# Patient Record
Sex: Female | Born: 1975 | Race: Black or African American | Hispanic: No | Marital: Married | Smoking: Never smoker
Health system: Southern US, Community
[De-identification: ages and names within clinical notes are randomized; demographics above are authoritative.]

---

## 2007-04-12 ENCOUNTER — Inpatient Hospital Stay (HOSPITAL_COMMUNITY): Admission: AD | Admit: 2007-04-12 | Discharge: 2007-04-14 | Payer: Self-pay | Admitting: Obstetrics & Gynecology

## 2007-04-15 ENCOUNTER — Encounter (HOSPITAL_COMMUNITY): Admission: RE | Admit: 2007-04-15 | Discharge: 2007-05-15 | Payer: Self-pay | Admitting: Obstetrics and Gynecology

## 2011-03-02 LAB — CBC
HCT: 38.5
MCHC: 33.8
MCHC: 34.4
MCV: 88.5
Platelets: 284
Platelets: 285
RBC: 4.57
RDW: 14.8
WBC: 13.7 — ABNORMAL HIGH

## 2011-03-02 LAB — CCBB MATERNAL DONOR DRAW

## 2016-10-28 ENCOUNTER — Other Ambulatory Visit: Payer: Self-pay | Admitting: Obstetrics and Gynecology

## 2016-10-28 DIAGNOSIS — R928 Other abnormal and inconclusive findings on diagnostic imaging of breast: Secondary | ICD-10-CM

## 2016-11-03 ENCOUNTER — Ambulatory Visit
Admission: RE | Admit: 2016-11-03 | Discharge: 2016-11-03 | Disposition: A | Discharge status: Home / Self Care | Payer: Federal, State, Local not specified - PPO | Source: Ambulatory Visit | Attending: Obstetrics and Gynecology | Admitting: Obstetrics and Gynecology

## 2016-11-03 ENCOUNTER — Ambulatory Visit
Admission: RE | Admit: 2016-11-03 | Discharge: 2016-11-03 | Disposition: A | Discharge status: Home / Self Care | Payer: Self-pay | Source: Ambulatory Visit | Attending: Obstetrics and Gynecology | Admitting: Obstetrics and Gynecology

## 2016-11-03 DIAGNOSIS — R928 Other abnormal and inconclusive findings on diagnostic imaging of breast: Secondary | ICD-10-CM

## 2017-12-26 ENCOUNTER — Other Ambulatory Visit: Payer: Self-pay | Admitting: Obstetrics and Gynecology

## 2017-12-26 DIAGNOSIS — R928 Other abnormal and inconclusive findings on diagnostic imaging of breast: Secondary | ICD-10-CM

## 2017-12-28 ENCOUNTER — Ambulatory Visit
Admission: RE | Admit: 2017-12-28 | Discharge: 2017-12-28 | Disposition: A | Discharge status: Home / Self Care | Payer: Federal, State, Local not specified - PPO | Source: Ambulatory Visit | Attending: Obstetrics and Gynecology | Admitting: Obstetrics and Gynecology

## 2017-12-28 DIAGNOSIS — R928 Other abnormal and inconclusive findings on diagnostic imaging of breast: Secondary | ICD-10-CM

## 2018-05-24 HISTORY — PX: OTHER SURGICAL HISTORY: SHX169

## 2018-10-11 ENCOUNTER — Other Ambulatory Visit: Payer: Self-pay | Admitting: Obstetrics and Gynecology

## 2018-10-11 DIAGNOSIS — N6320 Unspecified lump in the left breast, unspecified quadrant: Secondary | ICD-10-CM

## 2018-10-31 ENCOUNTER — Other Ambulatory Visit: Payer: Self-pay

## 2018-10-31 ENCOUNTER — Other Ambulatory Visit: Payer: Self-pay | Admitting: Obstetrics and Gynecology

## 2018-10-31 ENCOUNTER — Ambulatory Visit
Admission: RE | Admit: 2018-10-31 | Discharge: 2018-10-31 | Disposition: A | Discharge status: Home / Self Care | Payer: Federal, State, Local not specified - PPO | Source: Ambulatory Visit | Attending: Obstetrics and Gynecology | Admitting: Obstetrics and Gynecology

## 2018-10-31 DIAGNOSIS — N6320 Unspecified lump in the left breast, unspecified quadrant: Secondary | ICD-10-CM

## 2018-11-02 ENCOUNTER — Other Ambulatory Visit: Payer: Self-pay

## 2018-11-02 ENCOUNTER — Ambulatory Visit
Admission: RE | Admit: 2018-11-02 | Discharge: 2018-11-02 | Disposition: A | Discharge status: Home / Self Care | Payer: Federal, State, Local not specified - PPO | Source: Ambulatory Visit | Attending: Obstetrics and Gynecology | Admitting: Obstetrics and Gynecology

## 2018-11-02 DIAGNOSIS — N6320 Unspecified lump in the left breast, unspecified quadrant: Secondary | ICD-10-CM

## 2019-05-25 HISTORY — PX: OTHER SURGICAL HISTORY: SHX169

## 2021-04-14 IMAGING — US ULTRASOUND GUIDED BREAST CYST ASPIRATION
1 series · 2 of 2 positions shown · non-contrast
Comparison: Previous exams.

CLINICAL DATA: 43-year-old female presenting for ultrasound-guided
aspiration of a benign left breast cyst.

EXAM:
ULTRASOUND GUIDED LEFT BREAST CYST ASPIRATION

[Series 1: ultrasound guided breast cyst aspiration · 0.06mm/px · 2 of 2 slices shown]
[im 1/2]
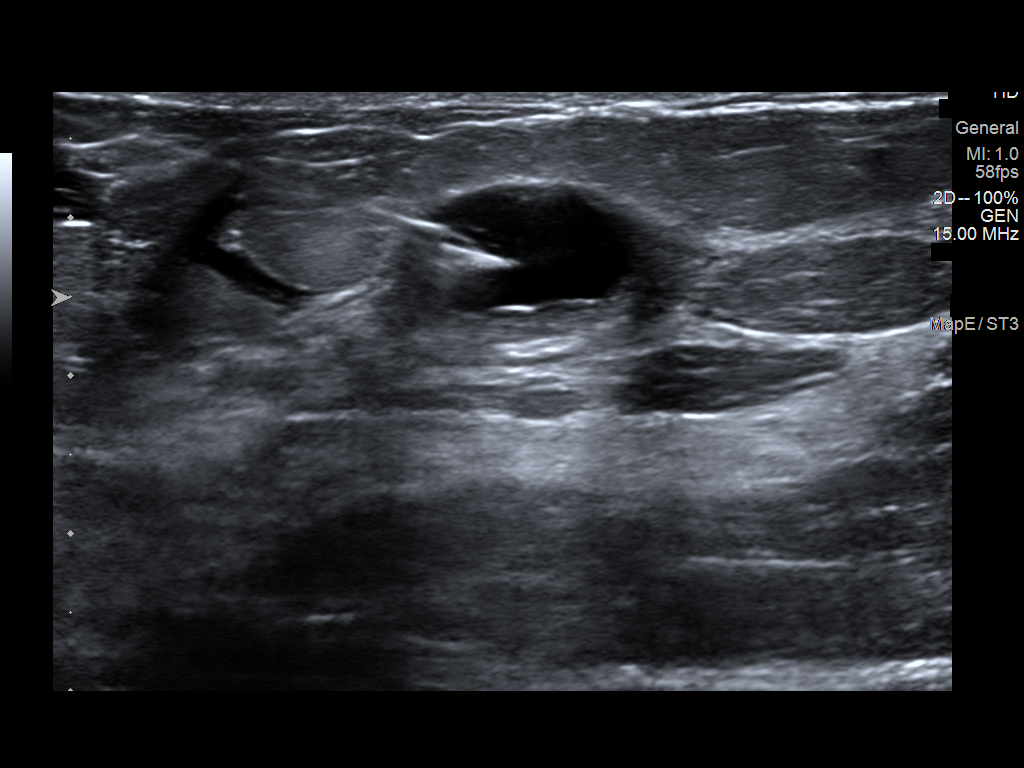
[im 2/2]
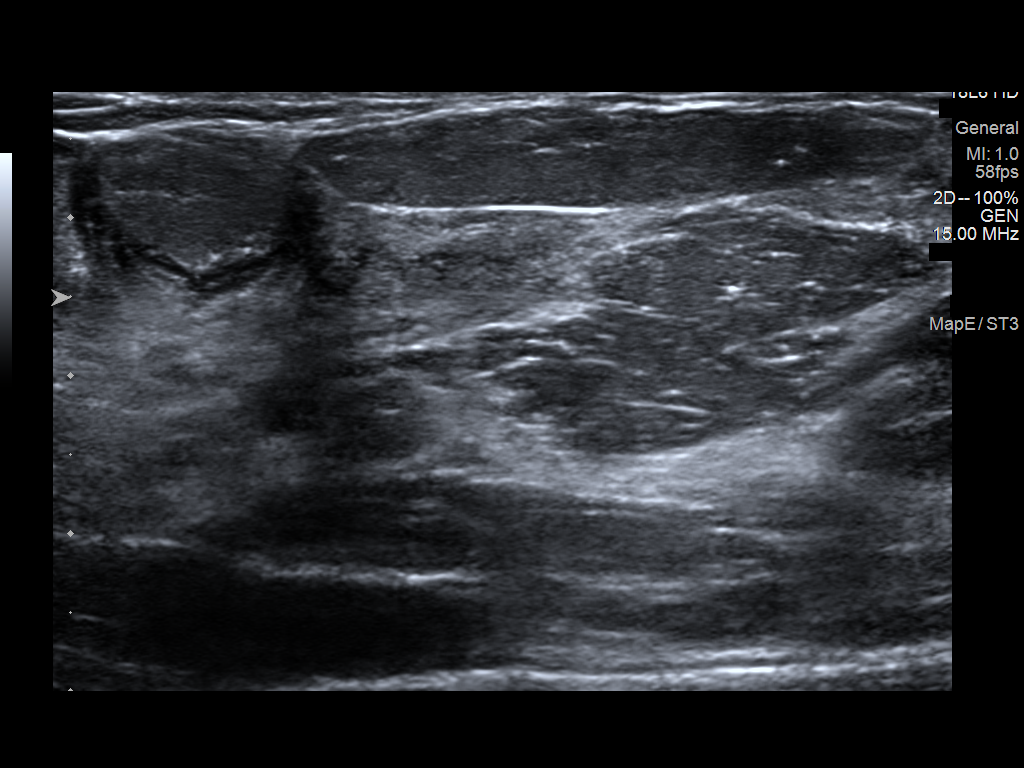

[2 of 2 positions shown; findings below may reference images not displayed]

PROCEDURE:
Using sterile technique, 1% lidocaine, under direct ultrasound
visualization, needle aspiration of a cyst at the 1 o'clock position
of the left breast was performed. The cyst was aspirated to
completion.
IMPRESSION: Ultrasound-guided aspiration of a left breast cyst to completion. No
apparent complications.

RECOMMENDATIONS:
Annual screening mammography in December 2018.

## 2021-06-17 ENCOUNTER — Encounter (HOSPITAL_BASED_OUTPATIENT_CLINIC_OR_DEPARTMENT_OTHER): Payer: Self-pay | Admitting: Obstetrics and Gynecology

## 2021-06-17 ENCOUNTER — Other Ambulatory Visit: Payer: Self-pay

## 2021-06-17 NOTE — Progress Notes (Addendum)
Spoke w/ via phone for pre-op interview---pt Lab needs dos----   t & s, urine preg            Lab results------cbc and cmp 1-05-26-2021 epic can use for 06-22-2021  surgery per dr Corinna Capra COVID test -----patient states asymptomatic no test needed Arrive at -------530 am 06-22-2021 NPO after MN NO Solid Food.  Clear liquids from MN until---430 am Med rec completed Medications to take morning of surgery -----none Diabetic medication -----n/a Patient instructed no nail polish to be worn day of surgery Patient instructed to bring photo id and insurance card day of surgery Patient aware to have Driver (ride ) / caregiver    for 24 hours after surgery  Elizabeth Shannon spouse Pre-Op special Istructions -----none Patient verbalized understanding of instructions that were given at this phone interview. Patient denies shortness of breath, chest pain, fever, cough at this phone interview.

## 2021-06-21 NOTE — H&P (Addendum)
Elizabeth Shannon is an 46 y.o. female with AUB, IUD and known fibroids.  SHe underwent a SHG showing several fibroids including a 2.4cm intracavitary fibroid.  She presents for Surgical evaluation and treatment of this including resection of fibroid and sonata fibroid ablation as well as removal of her IUD No LMP recorded.    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 No pertinent family history.  Social History:  reports that she has never smoked. She has never used smokeless tobacco. She reports current alcohol use. She reports that she does not use drugs.  Allergies:  Allergies  Allergen Reactions   Other     Gluten sensitivity hives whelps    Sulfa Antibiotics Rash    No medications prior to admission.    Review of Systems  Height 5\' 4"  (1.626 m), weight 113.4 kg. Physical Exam  Vitals and nursing note reviewed. Exam conducted with a chaperone present.  Constitutional:      Appearance: Normal appearance.  HENT:     Head: Normocephalic.  Eyes:     Pupils: Pupils are equal, round, and reactive to light.  Cardiovascular:     Rate and Rhythm: Normal rate and regular rhythm.     Pulses: Normal pulses.  Abdominal:     General: Abdomen is Gravid, nontender Neurological:     Mental Status: She is alert.  Uterus 8-10 weeks size SHG Shows 3.7cm,3.3 cm, and 2.1cm intramural  fibroids and the 2.4cm intracavitary fibroid  No results found for this or any previous visit (from the past 24 hour(s)).  No results found.  Assessment/Plan: AUB Uterine fibroids 3 of which are intramural and one intracavitary IUD Plan removing the IUD and myosure resection of intracavitary fibroid and then Sonata fibroid ablation of the intramural fibroids. Risks and benefits of the procedure were discussed at length which include but not limited to risks of infection, injury to bowel, bladder, uterus, ovaries, risk of blood loss, blood transfusion, possible laparotomy, and risks associated with anesthesia.  She gives her informed consent and wishes to proceed.   Luz Lex 06/21/2021, 9:19 PM  This patient has been seen and examined.   All of her questions were answered.  Labs and vital signs reviewed.  Informed consent has been obtained.  The History and Physical is current.

## 2021-06-21 NOTE — Anesthesia Preprocedure Evaluation (Addendum)
Anesthesia Evaluation  Patient identified by MRN, date of birth, ID band Patient awake    Reviewed: Allergy & Precautions, NPO status , Patient's Chart, lab work & pertinent test results  Airway Mallampati: II  TM Distance: >3 FB Neck ROM: Full    Dental no notable dental hx. (+) Dental Advisory Given, Teeth Intact   Pulmonary neg pulmonary ROS,    Pulmonary exam normal breath sounds clear to auscultation       Cardiovascular negative cardio ROS Normal cardiovascular exam Rhythm:Regular Rate:Normal     Neuro/Psych negative neurological ROS     GI/Hepatic Neg liver ROS, GERD  ,  Endo/Other  Morbid obesity  Renal/GU negative Renal ROS     Musculoskeletal  (+) Arthritis ,   Abdominal (+) + obese,   Peds  Hematology negative hematology ROS (+)   Anesthesia Other Findings   Reproductive/Obstetrics                           Anesthesia Physical Anesthesia Plan  ASA: 3  Anesthesia Plan: General   Post-op Pain Management: Minimal or no pain anticipated, Tylenol PO (pre-op) and Toradol IV (intra-op)   Induction: Intravenous  PONV Risk Score and Plan: 4 or greater and Treatment may vary due to age or medical condition, Midazolam, Ondansetron, Dexamethasone and Scopolamine patch - Pre-op  Airway Management Planned: LMA  Additional Equipment:   Intra-op Plan:   Post-operative Plan: Extubation in OR  Informed Consent: I have reviewed the patients History and Physical, chart, labs and discussed the procedure including the risks, benefits and alternatives for the proposed anesthesia with the patient or authorized representative who has indicated his/her understanding and acceptance.     Dental advisory given  Plan Discussed with: CRNA  Anesthesia Plan Comments:        Anesthesia Quick Evaluation

## 2021-06-22 ENCOUNTER — Ambulatory Visit (HOSPITAL_BASED_OUTPATIENT_CLINIC_OR_DEPARTMENT_OTHER)
Admission: RE | Admit: 2021-06-22 | Discharge: 2021-06-22 | Disposition: A | Discharge status: Home / Self Care | Payer: Federal, State, Local not specified - PPO | Attending: Obstetrics and Gynecology | Admitting: Obstetrics and Gynecology

## 2021-06-22 ENCOUNTER — Ambulatory Visit (HOSPITAL_BASED_OUTPATIENT_CLINIC_OR_DEPARTMENT_OTHER): Payer: Federal, State, Local not specified - PPO | Admitting: Anesthesiology

## 2021-06-22 ENCOUNTER — Other Ambulatory Visit: Payer: Self-pay

## 2021-06-22 ENCOUNTER — Encounter (HOSPITAL_BASED_OUTPATIENT_CLINIC_OR_DEPARTMENT_OTHER)
Admission: RE | Disposition: A | Discharge status: Home / Self Care | Payer: Self-pay | Source: Home / Self Care | Attending: Obstetrics and Gynecology

## 2021-06-22 ENCOUNTER — Encounter (HOSPITAL_BASED_OUTPATIENT_CLINIC_OR_DEPARTMENT_OTHER): Payer: Self-pay | Admitting: Obstetrics and Gynecology

## 2021-06-22 DIAGNOSIS — Z01818 Encounter for other preprocedural examination: Secondary | ICD-10-CM

## 2021-06-22 DIAGNOSIS — D251 Intramural leiomyoma of uterus: Secondary | ICD-10-CM | POA: Insufficient documentation

## 2021-06-22 DIAGNOSIS — Z6841 Body Mass Index (BMI) 40.0 and over, adult: Secondary | ICD-10-CM | POA: Insufficient documentation

## 2021-06-22 DIAGNOSIS — Z30432 Encounter for removal of intrauterine contraceptive device: Secondary | ICD-10-CM | POA: Insufficient documentation

## 2021-06-22 DIAGNOSIS — N939 Abnormal uterine and vaginal bleeding, unspecified: Secondary | ICD-10-CM | POA: Insufficient documentation

## 2021-06-22 HISTORY — PX: DILATATION & CURETTAGE/HYSTEROSCOPY WITH MYOSURE: SHX6511

## 2021-06-22 LAB — TYPE AND SCREEN
ABO/RH(D): B POS
Antibody Screen: NEGATIVE

## 2021-06-22 LAB — ABO/RH: ABO/RH(D): B POS

## 2021-06-22 LAB — POCT PREGNANCY, URINE: Preg Test, Ur: NEGATIVE

## 2021-06-22 SURGERY — DILATATION & CURETTAGE/HYSTEROSCOPY WITH MYOSURE
Anesthesia: General | Site: Vagina

## 2021-06-22 MED ORDER — SODIUM CHLORIDE 0.9 % IV SOLN
INTRAVENOUS | Status: AC
  Filled 2021-06-22: qty 2

## 2021-06-22 MED ORDER — FENTANYL CITRATE (PF) 100 MCG/2ML IJ SOLN
INTRAMUSCULAR | Status: DC | PRN
Start: 2021-06-22 — End: 2021-06-22
  Administered 2021-06-22 (×4): 50 ug via INTRAVENOUS

## 2021-06-22 MED ORDER — POVIDONE-IODINE 10 % EX SWAB: 2.0000 "application " | Freq: Once | CUTANEOUS | Status: DC

## 2021-06-22 MED ORDER — FENTANYL CITRATE (PF) 100 MCG/2ML IJ SOLN
INTRAMUSCULAR | Status: AC
  Filled 2021-06-22: qty 2

## 2021-06-22 MED ORDER — LACTATED RINGERS IV SOLN: INTRAVENOUS | Status: DC

## 2021-06-22 MED ORDER — FENTANYL CITRATE (PF) 100 MCG/2ML IJ SOLN
25.0000 ug | INTRAMUSCULAR | Status: DC | PRN
  Administered 2021-06-22: 50 ug via INTRAVENOUS

## 2021-06-22 MED ORDER — MEPERIDINE HCL 25 MG/ML IJ SOLN: 6.2500 mg | INTRAMUSCULAR | Status: DC | PRN

## 2021-06-22 MED ORDER — KETOROLAC TROMETHAMINE 30 MG/ML IJ SOLN
INTRAMUSCULAR | Status: DC | PRN
Start: 2021-06-22 — End: 2021-06-22
  Administered 2021-06-22: 30 mg via INTRAVENOUS

## 2021-06-22 MED ORDER — KETOROLAC TROMETHAMINE 30 MG/ML IJ SOLN
INTRAMUSCULAR | Status: AC
  Filled 2021-06-22: qty 1

## 2021-06-22 MED ORDER — DEXAMETHASONE SODIUM PHOSPHATE 10 MG/ML IJ SOLN
INTRAMUSCULAR | Status: AC
  Filled 2021-06-22: qty 1

## 2021-06-22 MED ORDER — PROMETHAZINE HCL 25 MG/ML IJ SOLN: 6.2500 mg | INTRAMUSCULAR | Status: DC | PRN

## 2021-06-22 MED ORDER — STERILE WATER FOR IRRIGATION IR SOLN
Status: DC | PRN
  Administered 2021-06-22: 500 mL

## 2021-06-22 MED ORDER — LIDOCAINE HCL (CARDIAC) PF 100 MG/5ML IV SOSY
PREFILLED_SYRINGE | INTRAVENOUS | Status: DC | PRN
Start: 2021-06-22 — End: 2021-06-22
  Administered 2021-06-22: 100 mg via INTRAVENOUS

## 2021-06-22 MED ORDER — SODIUM CHLORIDE 0.9 % IR SOLN
Status: DC | PRN
  Administered 2021-06-22: 3000 mL

## 2021-06-22 MED ORDER — SCOPOLAMINE 1 MG/3DAYS TD PT72
1.0000 | MEDICATED_PATCH | TRANSDERMAL | Status: DC
  Administered 2021-06-22: 1.5 mg via TRANSDERMAL

## 2021-06-22 MED ORDER — ONDANSETRON HCL 4 MG/2ML IJ SOLN
INTRAMUSCULAR | Status: DC | PRN
Start: 2021-06-22 — End: 2021-06-22
  Administered 2021-06-22: 4 mg via INTRAVENOUS

## 2021-06-22 MED ORDER — LIDOCAINE HCL (PF) 2 % IJ SOLN
INTRAMUSCULAR | Status: AC
  Filled 2021-06-22: qty 5

## 2021-06-22 MED ORDER — MIDAZOLAM HCL 5 MG/5ML IJ SOLN
INTRAMUSCULAR | Status: DC | PRN
  Administered 2021-06-22: 2 mg via INTRAVENOUS

## 2021-06-22 MED ORDER — ACETAMINOPHEN 500 MG PO TABS
ORAL_TABLET | ORAL | Status: AC
  Filled 2021-06-22: qty 2

## 2021-06-22 MED ORDER — PROPOFOL 10 MG/ML IV BOLUS
INTRAVENOUS | Status: AC
  Filled 2021-06-22: qty 20

## 2021-06-22 MED ORDER — DEXAMETHASONE SODIUM PHOSPHATE 4 MG/ML IJ SOLN
INTRAMUSCULAR | Status: DC | PRN
  Administered 2021-06-22: 10 mg via INTRAVENOUS

## 2021-06-22 MED ORDER — MIDAZOLAM HCL 2 MG/2ML IJ SOLN
INTRAMUSCULAR | Status: AC
  Filled 2021-06-22: qty 2

## 2021-06-22 MED ORDER — PROPOFOL 10 MG/ML IV BOLUS
INTRAVENOUS | Status: DC | PRN
  Administered 2021-06-22: 200 mg via INTRAVENOUS

## 2021-06-22 MED ORDER — SCOPOLAMINE 1 MG/3DAYS TD PT72
MEDICATED_PATCH | TRANSDERMAL | Status: AC
  Filled 2021-06-22: qty 1

## 2021-06-22 MED ORDER — ONDANSETRON HCL 4 MG/2ML IJ SOLN
INTRAMUSCULAR | Status: AC
  Filled 2021-06-22: qty 2

## 2021-06-22 MED ORDER — ACETAMINOPHEN 500 MG PO TABS
1000.0000 mg | ORAL_TABLET | Freq: Once | ORAL | Status: AC
  Administered 2021-06-22: 1000 mg via ORAL

## 2021-06-22 MED ORDER — SODIUM CHLORIDE 0.9 % IV SOLN
2.0000 g | INTRAVENOUS | Status: AC
  Administered 2021-06-22: 2 g via INTRAVENOUS

## 2021-06-22 SURGICAL SUPPLY — 36 items
BIPOLAR CUTTING LOOP 21FR (ELECTRODE)
CATH ROBINSON RED A/P 16FR (CATHETERS) ×4 IMPLANT
COUNTER NEEDLE 1200 MAGNETIC (NEEDLE) ×4 IMPLANT
DEVICE MYOSURE LITE (MISCELLANEOUS) IMPLANT
DEVICE MYOSURE REACH (MISCELLANEOUS) IMPLANT
DILATOR CANAL MILEX (MISCELLANEOUS) IMPLANT
DRSG TELFA 3X8 NADH (GAUZE/BANDAGES/DRESSINGS) ×3 IMPLANT
ELECT DISPERSIVE SONATA (ELECTRODE) ×8 IMPLANT
ELECT REM PT RETURN 9FT ADLT (ELECTROSURGICAL)
ELECTRODE REM PT RTRN 9FT ADLT (ELECTROSURGICAL) IMPLANT
GAUZE 4X4 16PLY ~~LOC~~+RFID DBL (SPONGE) ×8 IMPLANT
GLOVE SURG ENC MOIS LTX SZ8 (GLOVE) ×4 IMPLANT
GLOVE SURG ORTHO LTX SZ8 (GLOVE) ×8 IMPLANT
GLOVE SURG UNDER POLY LF SZ6 (GLOVE) ×6 IMPLANT
GLOVE SURG UNDER POLY LF SZ7.5 (GLOVE) ×2 IMPLANT
GOWN STRL REUS W/ TWL XL LVL3 (GOWN DISPOSABLE) ×3 IMPLANT
GOWN STRL REUS W/TWL LRG LVL3 (GOWN DISPOSABLE) ×6 IMPLANT
GOWN STRL REUS W/TWL XL LVL3 (GOWN DISPOSABLE) ×7 IMPLANT
HANDPIECE RFA SONATA (MISCELLANEOUS) ×4 IMPLANT
IV NS 1000ML (IV SOLUTION) ×3
IV NS 1000ML BAXH (IV SOLUTION) ×1 IMPLANT
IV NS IRRIG 3000ML ARTHROMATIC (IV SOLUTION) ×4 IMPLANT
KIT PROCEDURE FLUENT (KITS) ×4 IMPLANT
KIT TURNOVER CYSTO (KITS) ×4 IMPLANT
LOOP CUTTING BIPOLAR 21FR (ELECTRODE) IMPLANT
MYOSURE XL FIBROID (MISCELLANEOUS) ×3
PACK VAGINAL MINOR WOMEN LF (CUSTOM PROCEDURE TRAY) ×4 IMPLANT
PAD DRESSING TELFA 3X8 NADH (GAUZE/BANDAGES/DRESSINGS) ×2 IMPLANT
PAD OB MATERNITY 4.3X12.25 (PERSONAL CARE ITEMS) ×4 IMPLANT
PAD PREP 24X48 CUFFED NSTRL (MISCELLANEOUS) ×4 IMPLANT
SEAL CERVICAL OMNI LOK (ABLATOR) IMPLANT
SEAL ROD LENS SCOPE MYOSURE (ABLATOR) ×4 IMPLANT
SYR 50ML LL SCALE MARK (SYRINGE) ×4 IMPLANT
SYSTEM TISS REMOVAL MYOSURE XL (MISCELLANEOUS) ×1 IMPLANT
TOWEL OR 17X26 10 PK STRL BLUE (TOWEL DISPOSABLE) ×4 IMPLANT
WATER STERILE IRR 500ML POUR (IV SOLUTION) ×4 IMPLANT

## 2021-06-22 NOTE — Anesthesia Postprocedure Evaluation (Signed)
Anesthesia Post Note  Patient: Elizabeth Shannon  Procedure(s) Performed: DILATATION & CURETTAGE/HYSTEROSCOPY WITH MYOSURE (Vagina ) Radio Frequency Ablation with Sonata (Vagina )     Patient location during evaluation: PACU Anesthesia Type: General Level of consciousness: sedated and patient cooperative Pain management: pain level controlled Vital Signs Assessment: post-procedure vital signs reviewed and stable Respiratory status: spontaneous breathing Cardiovascular status: stable Anesthetic complications: no   No notable events documented.  Last Vitals:  Vitals:   06/22/21 0915 06/22/21 0935  BP: (!) 142/94 138/89  Pulse: 71 68  Resp: 11 18  Temp: (!) 36.3 C (!) 36.2 C  SpO2: 97% 100%    Last Pain:  Vitals:   06/22/21 1000  TempSrc:   PainSc: [place]

## 2021-06-22 NOTE — Anesthesia Procedure Notes (Signed)
Procedure Name: LMA Insertion Date/Time: 06/22/2021 7:43 AM Performed by: Justice Rocher, CRNA Pre-anesthesia Checklist: Patient identified, Emergency Drugs available, Suction available, Patient being monitored and Timeout performed Patient Re-evaluated:Patient Re-evaluated prior to induction Oxygen Delivery Method: Circle system utilized Preoxygenation: Pre-oxygenation with 100% oxygen Induction Type: IV induction Ventilation: Mask ventilation without difficulty LMA: LMA inserted LMA Size: 4.0 Number of attempts: 1 Airway Equipment and Method: Bite block Placement Confirmation: positive ETCO2, breath sounds checked- equal and bilateral and CO2 detector Tube secured with: Tape Dental Injury: Teeth and Oropharynx as per pre-operative assessment

## 2021-06-22 NOTE — Transfer of Care (Signed)
Immediate Anesthesia Transfer of Care Note  Patient: Elizabeth Shannon  Procedure(s) Performed: Procedure(s) (LRB): DILATATION & CURETTAGE/HYSTEROSCOPY WITH MYOSURE (N/A) Radio Frequency Ablation with Sonata (N/A)  Patient Location: PACU  Anesthesia Type: General  Level of Consciousness: awake, sedated, patient cooperative and responds to stimulation  Airway & Oxygen Therapy: Patient Spontanous Breathing and Patient connected to  02 and soft FM   Post-op Assessment: Report given to PACU RN, Post -op Vital signs reviewed and stable and Patient moving all extremities  Post vital signs: Reviewed and stable  Complications: No apparent anesthesia complications

## 2021-06-22 NOTE — Discharge Instructions (Addendum)
DISCHARGE INSTRUCTIONS: D&C / D&E The following instructions have been prepared to help you care for yourself upon your return home.   Personal hygiene:  Use sanitary pads for vaginal drainage, not tampons.  Shower the day after your procedure.  NO tub baths, pools or Jacuzzis for 2-3 weeks.  Wipe front to back after using the bathroom.  Activity and limitations:  Do NOT drive or operate any equipment for 24 hours. The effects of anesthesia are still present and drowsiness may result.  Do NOT rest in bed all day.  Walking is encouraged.  Walk up and down stairs slowly.  You may resume your normal activity in one to two days or as indicated by your physician.  Sexual activity: NO intercourse for at least 2 weeks after the procedure, or as indicated by your physician.  Diet: Eat a light meal as desired this evening. You may resume your usual diet tomorrow.  Return to work: You may resume your work activities in one to two days or as indicated by your doctor.  What to expect after your surgery: Expect to have vaginal bleeding/discharge for 2-3 days and spotting for up to 10 days. It is not unusual to have soreness for up to 1-2 weeks. You may have a slight burning sensation when you urinate for the first day. Mild cramps may continue for a couple of days. You may have a regular period in 2-6 weeks.  Call your doctor for any of the following:  Excessive vaginal bleeding, saturating and changing one pad every hour.  Inability to urinate 6 hours after discharge from hospital.  Pain not relieved by pain medication.  Fever of 100.4 F or greater.  Unusual vaginal discharge or odor.  Post Anesthesia Home Care Instructions  Activity: Get plenty of rest for the remainder of the day. A responsible individual must stay with you for 24 hours following the procedure.  For the next 24 hours, DO NOT: -Drive a car -Paediatric nurse -Drink alcoholic beverages -Take any medication unless  instructed by your physician -Make any legal decisions or sign important papers.  Meals: Start with liquid foods such as gelatin or soup. Progress to regular foods as tolerated. Avoid greasy, spicy, heavy foods. If nausea and/or vomiting occur, drink only clear liquids until the nausea and/or vomiting subsides. Call your physician if vomiting continues.  Special Instructions/Symptoms: Your throat may feel dry or sore from the anesthesia or the breathing tube placed in your throat during surgery. If this causes discomfort, gargle with warm salt water. The discomfort should disappear within 24 hours.  If you had a scopolamine patch placed behind your ear for the management of post- operative nausea and/or vomiting:  1. The medication in the patch is effective for 72 hours, after which it should be removed.  Wrap patch in a tissue and discard in the trash. Wash hands thoroughly with soap and water. 2. You may remove the patch earlier than 72 hours if you experience unpleasant side effects which may include dry mouth, dizziness or visual disturbances. 3. Avoid touching the patch. Wash your hands with soap and water after contact with the patch.  No acetaminophen/Tylenol until after 12:15 pm today if needed. No ibuprofen, Advil, Aleve, Motrin, ketorolac, meloxicam, naproxen, or other NSAIDS until after 2:45 pm today if needed.

## 2021-06-23 ENCOUNTER — Encounter (HOSPITAL_BASED_OUTPATIENT_CLINIC_OR_DEPARTMENT_OTHER): Payer: Self-pay | Admitting: Obstetrics and Gynecology

## 2021-06-23 LAB — SURGICAL PATHOLOGY

## 2021-06-23 NOTE — Op Note (Signed)
NAME: Elizabeth Shannon, Elizabeth Shannon MEDICAL RECORD NO: [id number] ACCOUNT NO: [id number] DATE OF BIRTH: 1975/09/27 FACILITY: [facility] LOCATION: [facility] PHYSICIAN: Monia Sabal. Corinna Capra, MD  Operative Report   DATE OF PROCEDURE: 06/22/2021  PREOPERATIVE DIAGNOSES:  Abnormal uterine bleeding, uterine fibroids with intracavitary location and intramural, and intrauterine device.  POSTOPERATIVE DIAGNOSES:  Abnormal uterine bleeding, uterine fibroids with intracavitary location and intramural.  Intrauterine device removed.  PROCEDURES:  Removal of intrauterine device, hysteroscopy with MyoSure resection of intracavitary fibroid, and Sonata fibroid ablations.  SURGEON:  Monia Sabal. Corinna Capra, MD  ANESTHESIA:  General by LMA.  INDICATIONS:  The patient is a 46 year old female with abnormal uterine bleeding, IUD, and with known fibroids.  She underwent a sonohysterogram showing several fibroids including a 2.4 cm intracavitary fibroid.  She also has 2 other intramural fibroids  one 3.7 and one 3.3 cm in size.  She presents today for surgical intervention evaluation and removal of these fibroids, planned resection of intracavitary fibroid and fibroid ablation of the intramural fibroids, and removal of the IUD. We discussed the  risks and benefits at length, the proposed plan of care and recovery time, and alternatives given the risks of the procedure, which include but not limited to risk of infection, bleeding, damage to the uterus, tubes, ovaries, bowel and bladder,  recurrence of the bleeding, recurrence of the fibroid.  She gives her informed consent and wishes to proceed.  FINDINGS: Findings at the time of surgery were a 2.5 cm intracavitary uterine fibroid, otherwise normal-appearing endometrial cavity and a 2.2, 3.1, and 3.0 intramural fibroids.  DESCRIPTION OF PROCEDURE:  After adequate analgesia, the patient was placed in the dorsal lithotomy position.  She was sterilely prepped and draped.  Bladder was sterilely  drained.  Graves speculum was placed.  Tenaculum was placed on the anterior lip of  the cervix.  The IUD string was seen, grasped, and the IUD was removed and appeared to be normal.  The cervix was dilated to a #23 Pratt dilator.  Hysteroscope was inserted, and the above findings were noted.  Sonata device was inserted, and after  careful surveillance of the uterus revealing the 3 fibroids as discussed in the findings, each fibroid was identified.  Went through the appropriate checks on the ultrasound, and fibroid ablation was carried out on fibroid #1, which was 3.1 x 2.3 cm in  size at the 12 o'clock position with a total ablation time of 3 minutes and 12 seconds.  Second fibroid was noted also at 12 o'clock in the mid position.  It was ablated 2 times.  The first being more than 30 seconds and second being 1 minute and 54  seconds. The third fibroid was located at the 9 o'clock position of the mid portion of the uterus. It was 3 x 2.2 cm in size and was ablated for 3 minutes after all the safety checks.  Re-evaluation after the procedure revealed good appearance of  ablation without any obvious complications noted.  Sonata device was then removed.  Hysteroscope was then reinserted.  The intracavitary fibroid at this time had been noted to be well devascularized from the previous ablation.  The MyoSure XL was  inserted, and under direct visualization, the fibroid was dissected to completion with a normal-appearing cavity after the procedure with no obvious evidence of perforation or injury.  Ostia were noted to be normal as was the cervix.  The MyoSure was  then removed.  Tenaculum was removed from the cervix and noted to be hemostatic.  The patient was transferred to recovery room in stable condition.  Sponge, needle, and instrument count was normal x3.  ESTIMATED BLOOD LOSS:  50 mL.  FLUIDS:  Saline deficit was 295 mL.  ANTIBIOTICS:  The patient received 2 g of cefotetan  preoperatively.  DISPOSITION:  The patient will be discharged home with followup in the office in 2-3 weeks.  Sent her with routine instruction sheet for D and C with instructions to return for any increased pain, fever, or bleeding.   [facility] D: 06/22/2021 8:54:32 am T: 06/22/2021 10:41:00 pm  JOB: [id number]/ [id number]
